# Patient Record
Sex: Male | Born: 1955 | Race: Black or African American | Hispanic: No | Marital: Single | Smoking: Former smoker
Health system: Southern US, Community
[De-identification: ages and names within clinical notes are randomized; demographics above are authoritative.]

---

## 2002-08-08 ENCOUNTER — Emergency Department (HOSPITAL_COMMUNITY): Admission: EM | Admit: 2002-08-08 | Discharge: 2002-08-08 | Payer: Self-pay | Admitting: Emergency Medicine

## 2008-03-14 ENCOUNTER — Inpatient Hospital Stay (HOSPITAL_COMMUNITY): Admission: EM | Admit: 2008-03-14 | Discharge: 2008-03-19 | Payer: Self-pay | Admitting: Emergency Medicine

## 2010-10-28 NOTE — Discharge Summary (Signed)
Cody Hammond, Cody Hammond                ACCOUNT NO.:  [id number]   MEDICAL RECORD NO.:  [id number]          PATIENT TYPE:  INP   LOCATION:  1404                         FACILITY:  [facility]   PHYSICIAN:  Altha Harm, MDDATE OF BIRTH:  1955/11/19   DATE OF ADMISSION:  03/11/2008  DATE OF DISCHARGE:  03/19/2008                               DISCHARGE SUMMARY   DISCHARGE DISPOSITION:  [facility] assisted living facility.   DISCHARGE DIAGNOSES:  1. Hypertensive urgency now resolved.  2. Hypertension, new diagnosis.  3. Schizotypal behavior.  4. Intractable emesis, resolved.  5. Adult failure to thrive.  6. Hypokalemia, resolved.   DISCHARGE MEDICATIONS:  1. Potassium chloride 40 mEq p.o. daily.  2. Thiamine 100 mg p.o. daily.  3. Folate 1 mg p.o. daily.  4. Hydrochlorothiazide 25 mg p.o. daily.  5. Lopressor 25 mg p.o. b.i.d.  6. Remeron 7.5 mg p.o. nightly.  7. Norvasc 2.5 mg p.o. daily.   CONSULTANT:  Dr. Jeanie Sewer, psychiatry.   PROCEDURE:  None.   DIAGNOSTIC STUDIES:  1. CT of the head without contrast done on admission which shows no      acute intracranial abnormalities.  2. Acute abdominal series done on the September 1938 which shows      possible proximal left ureteral calculus.  Consider noncontrast      abdomen and pelvis CT further evaluation if clinically warranted.      No active cardiopulmonary disease.  3. CT abdomen and pelvis without contrast which shows no acute      findings.      a.     No evidence of renal calculi or hydronephrosis.      b.     No evidence of urethral calculus.      c.     Mildly enlarged prostate cancer.      d.     Mild diffuse bladder wall thickening which may be due to       bladder outlet obstruction or possibly cystitis.  Clinical       correlation recommended.   ALLERGIES:  No known drug allergies.   CODE STATUS:  Full Code.   PRIMARY CARE:  Unassigned.   CHIEF COMPLAINT:  Weakness.   HISTORY OF PRESENT ILLNESS:  Please  refer to the H and P by Dr. Elmyra Ricks for details of the HPI.   HOSPITAL COURSE BY PROBLEM:  1. Adult failure to thrive.  The patient was found in the parking lot      and brought to the emergency room on observation basis.  The      patient did have a history of remote alcohol use.  However, with      the patient being unable to give any information, the patient was      started on Ativan prophylaxis against withdrawal, given the fact      his alcohol level was low.  The patient was able to talk, in the      discussion, he indicated that he had not drank alcohol in several      years, and  then the Ativan was discontinued.  The patient, however,      was having a poor appetite and stated he had not been taking for      some time.  A diet was started on the patient and the patient then      developed intractable vomiting for unknown etiologies.  The patient      had an investigation for the vomiting and all labs and radiologic      studies were found to be negative.  The patient in the meantime was      given IV hydration and started back on a diet of clear liquids and      advanced to a low-sodium diet, which he tolerated well.  2. Hypertension.  The patient was set to be discharged and developed      high blood pressures.  His blood pressures were markedly elevated      with systolic in the 200s and diastolics above 100.  The patient      was started on therapy and his medications titrated to the above      medications.  The patient's blood pressure is 140/86 at the time of      discharge.  The patient is to be followed up in the outpatient      setting for further titration of his medications.  3. Schizotypal behavior.  Initially there was no information regarding      this patient and he was unable to get very much on admission about      himself.  However, personnel from [employer] who know the      patient from him hanging around the [employer] and sitting in      the  parking lot came forward and volunteered some information about      the patient, lives in poor living conditions.  Social work was      contacted and when they investigated, they found this to be true.      A psychiatric evaluation was obtained and the patient was found to      be lacking capacity.  The patient is being sent to [facility]      assisted living facility.  The patient's payee and social worker      says the social service systems are involved in the plan and      execution his living plans for the patient.  The patient was      started on Remeron by psychiatry 7.5 mg p.o. nightly.  The patient      had some mild electrolyte imbalances including hypokalemia which      was repleted and the patient is to continue on potassium on an      ongoing basis.  Otherwise, the patient remained stable and he is      stable for discharge day.   FOLLOW UP:  The patient should establish with a primary care physician  to follow up within 2 weeks for titration of the blood pressure  medications.  In terms of his dietary restrictions, the patient should  be on a 2 grams sodium diet.  Please note, the patient was found to have  marginally elevated cholesterol with an LDL only 102.  He was not  started on any medications as this can be diet modified, and I will  defer that to his primary care physician as he establishes in the  outpatient setting.  However, I would emphasize a low-cholesterol, low-  sodium diet on this patient.  He was also tested for diabetes and his  hemoglobin A1c was 5.6, thus indicating no evidence of diabetes at this  time.  Physical restrictions, there are none.      Altha Harm, MD  Electronically Signed     MAM/MEDQ  D:  03/19/2008  T:  03/19/2008  Job:  [id number]

## 2010-10-28 NOTE — H&P (Signed)
Cody Hammond, Cody Hammond                ACCOUNT NO.:  [id number]   MEDICAL RECORD NO.:  [id number]          PATIENT TYPE:  INP   LOCATION:  1404                         FACILITY:  [facility]   PHYSICIAN:  Della Goo, M.D. DATE OF BIRTH:  [birth year]   DATE OF ADMISSION:  03/11/2008  DATE OF DISCHARGE:                              HISTORY & PHYSICAL   CHIEF COMPLAINT:  Weakness.   HISTORY OF THE PRESENT ILLNESS:  This is a 55 year old homeless male who  was brought to the emergency department by the Emergency Medical  Services after they  were called by bystanders who found him on the  sidewalk unable to get up.  The patient reports having increased pain in  both his legs with severe cramping.  He denies having any nausea,  vomiting, diarrhea, fevers, chills, chest pain, or shortness of breath.  He does report that he has been unable to eat very much.  He reports he  does not have an appetite.  He denies alcohol abuse or usage and drug  usage.   PAST MEDICAL HISTORY:  The past medical history is none per the  patient's report.   PAST SURGICAL HISTORY:  The past surgical history is none per the  patient's report.   ALLERGIES:  No known drug allergies.   SOCIAL HISTORY:  The patient is homeless.  He reports staying around the  [facility] area.  He reports tobacco usage.  Denies alcohol usage  and denies any drug usage.   FAMILY HISTORY:  The family history is noncontributory.   REVIEW OF SYSTEMS:  On the review of systems the pertinents are  mentioned above.   PHYSICAL EXAMINATION:  GENERAL APPEARANCE:  The physical examination  findings are that this is a disheveled, cachectic and emaciated-  appearing 55 year old male in discomfort, but in no acute distress.  VITAL SIGNS:  His vital signs are temperature of 98.3, blood pressure of  179/94, heart rate of 95, respirations of 20, and O2 saturations of 99%  on room air.  HEENT:  Normocephalic and atraumatic.  Temporal wasting  is present  bilaterally.  Pupils are equally round and react to light.  Extraocular  movements are intact.  Funduscopic is benign.  There is no scleral  icterus.  Oropharynx is clear.  Dentition is poor.  NECK:  The neck is supple.  Full range of motion.  No thyromegaly,  adenopathy or jugular venous distention.  HEART:  Cardiovascular reveals regular rate and rhythm.  No murmurs,  gallops or rubs.  LUNGS:  The lungs are clear to auscultation bilaterally.  ABDOMEN:  The abdomen has positive bowel sounds. It is soft, nontender  and nondistended.  EXTREMITIES:  The extremities are without cyanosis, clubbing or edema.  NEUROLOGIC:  The neurologic examination shows generalized weakness.  Speech is clear.  The patient is alert and oriented x3.  There are no  focal findings.   LABORATORY STUDIES:  White blood cell count 7.0, hemoglobin 13.2,  hematocrit 40.4 and platelets 159,000 with neutrophils 84% and  lymphocytes 10%.  Sodium 135, potassium 3.6, chloride 100, bicarb 27,  BUN 11, creatinine 1.0, and glucose 101.  CAT scan of the head was  performed by the ED physician the results of which were negative.  Alcohol level was also less than 5.0.   ASSESSMENT:  This is a 55 year old male being admitted with:  1. Weakness.  2. Failure to thrive.  3. Elevated blood pressure; probable hypertension.  4. Tobacco abuse.  5. Suspected alcohol use or abuse.   PLAN:  1. The patient will be admitted to a telemetry area for cardiac      monitoring.  2. The patient will be placed on a clear liquid diet for now.  3. IV fluids have been ordered for maintenance therapy and      rehydration.  4. The patient will be placed on clonidine as needed for elevated      blood pressures and his blood pressures will be further monitored.  5. GI and DVT prophylaxes have been ordered as well; and,  6. Case management evaluation will be requested.      Della Goo, M.D.  Electronically Signed      HJ/MEDQ  D:  03/12/2008  T:  03/13/2008  Job:  [id number]
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
## 2011-03-16 LAB — POCT I-STAT, CHEM 8
BUN: 11
Calcium, Ion: 1.02 — ABNORMAL LOW
Creatinine, Ser: 1
Glucose, Bld: 101 — ABNORMAL HIGH
Sodium: 135
TCO2: 27

## 2011-03-16 LAB — COMPREHENSIVE METABOLIC PANEL
ALT: 10
AST: 16
AST: 22
Albumin: 3 — ABNORMAL LOW
Albumin: 3.6
Alkaline Phosphatase: 39
BUN: 4 — ABNORMAL LOW
BUN: 4 — ABNORMAL LOW
CO2: 27
Calcium: 8.7
Chloride: 103
Chloride: 99
Creatinine, Ser: 0.75
Creatinine, Ser: 0.89
GFR calc Af Amer: >60
GFR calc non Af Amer: >60
Glucose, Bld: 88
Potassium: 3.2 — ABNORMAL LOW
Sodium: 137
Total Bilirubin: 0.7
Total Bilirubin: 1.2
Total Protein: 7.1

## 2011-03-16 LAB — CBC
HCT: 40.4
HCT: 43.2
MCHC: 32.6
MCV: 84.1
MCV: 84.5
MCV: 86.9
Platelets: 211
Platelets: 259
RBC: 4.23
RDW: 14
WBC: 5
WBC: 7

## 2011-03-16 LAB — URINALYSIS, ROUTINE W REFLEX MICROSCOPIC
Nitrite: NEGATIVE
Specific Gravity, Urine: 1.01
pH: 6

## 2011-03-16 LAB — RAPID URINE DRUG SCREEN, HOSP PERFORMED
Barbiturates: NOT DETECTED
Cocaine: NOT DETECTED
Opiates: NOT DETECTED

## 2011-03-16 LAB — MAGNESIUM: Magnesium: 2

## 2011-03-16 LAB — DIFFERENTIAL
Eosinophils Absolute: 0
Eosinophils Relative: 0
Lymphs Abs: 0.7

## 2011-03-16 LAB — CK: Total CK: 81

## 2011-03-17 LAB — BASIC METABOLIC PANEL
BUN: 3 — ABNORMAL LOW
BUN: 3 — ABNORMAL LOW
CO2: 32
CO2: 33 — ABNORMAL HIGH
CO2: 33 — ABNORMAL HIGH
Calcium: 8.6
Calcium: 8.8
Chloride: 100
Chloride: 100
Chloride: 97
Creatinine, Ser: 0.74
Creatinine, Ser: 0.75
Creatinine, Ser: 0.79
GFR calc Af Amer: >60
Glucose, Bld: 101 — ABNORMAL HIGH
Glucose, Bld: 109 — ABNORMAL HIGH
Glucose, Bld: 98
Potassium: 3.5

## 2011-03-17 LAB — LIPID PANEL
Cholesterol: 156
LDL Cholesterol: 102 — ABNORMAL HIGH
Total CHOL/HDL Ratio: 3.7
Triglycerides: 61
VLDL: 12

## 2011-03-17 LAB — GLUCOSE, CAPILLARY

## 2012-12-28 ENCOUNTER — Emergency Department (INDEPENDENT_AMBULATORY_CARE_PROVIDER_SITE_OTHER)
Admission: EM | Admit: 2012-12-28 | Discharge: 2012-12-28 | Disposition: A | Discharge status: Home / Self Care | Payer: Medicaid Other | Source: Home / Self Care | Attending: Family Medicine | Admitting: Family Medicine

## 2012-12-28 ENCOUNTER — Emergency Department (INDEPENDENT_AMBULATORY_CARE_PROVIDER_SITE_OTHER): Payer: Medicaid Other

## 2012-12-28 ENCOUNTER — Encounter (HOSPITAL_COMMUNITY): Payer: Self-pay | Admitting: Emergency Medicine

## 2012-12-28 DIAGNOSIS — R627 Adult failure to thrive: Secondary | ICD-10-CM

## 2012-12-28 DIAGNOSIS — I10 Essential (primary) hypertension: Secondary | ICD-10-CM

## 2012-12-28 DIAGNOSIS — R911 Solitary pulmonary nodule: Secondary | ICD-10-CM

## 2012-12-28 LAB — COMPREHENSIVE METABOLIC PANEL
AST: 16 U/L (ref 0–37)
Albumin: 3.9 g/dL (ref 3.5–5.2)
BUN: 8 mg/dL (ref 6–23)
Chloride: 98 mEq/L (ref 96–112)
Creatinine, Ser: 0.82 mg/dL (ref 0.50–1.35)
Total Bilirubin: 0.4 mg/dL (ref 0.3–1.2)
Total Protein: 7.7 g/dL (ref 6.0–8.3)

## 2012-12-28 LAB — POCT I-STAT, CHEM 8
BUN: 8 mg/dL (ref 6–23)
Chloride: 99 mEq/L (ref 96–112)
HCT: 44 % (ref 39.0–52.0)
Potassium: 3.8 mEq/L (ref 3.5–5.1)
Sodium: 138 mEq/L (ref 135–145)

## 2012-12-28 LAB — TSH: TSH: 3.49 u[IU]/mL (ref 0.350–4.500)

## 2012-12-28 LAB — HIV ANTIBODY (ROUTINE TESTING W REFLEX): HIV: NONREACTIVE

## 2012-12-28 MED ORDER — TRIAMTERENE-HCTZ 37.5-25 MG PO TABS
0.5000 | ORAL_TABLET | Freq: Every day | ORAL | Status: AC
Start: 2012-12-28 — End: ?

## 2012-12-28 MED ORDER — MEGESTROL ACETATE 625 MG/5ML PO SUSP: 625.0000 mg | Freq: Every day | ORAL | Status: AC

## 2012-12-28 MED ORDER — ONE-A-DAY MENS PO TABS: 1.0000 | ORAL_TABLET | Freq: Every day | ORAL | Status: AC

## 2012-12-28 NOTE — ED Provider Notes (Signed)
History    CSN: [id number] Arrival date & time 12/28/12  1115  First MD Initiated Contact with Patient 12/28/12 1158     Chief Complaint  Patient presents with  . Weight Loss  . Polydipsia  . Polyuria   (Consider location/radiation/quality/duration/timing/severity/associated sxs/prior Treatment) HPI Comments: 57 year old male with history of a psychiatric disorder and failure to thrive in the past. Here with his counselor concerned about weight loss. As per records and as per patient's report he has had problems with weight gaining for several years. Appears like in the last 2 weeks he has lost about 10 pounds which is more drastic than insidious weight loss in the past. He denies abdominal pain nausea vomiting or diarrhea. Denies chest pain which cough or shortness of breath. Patient has contradictory information sometimes saying that he is hungry and thirsty most of the time then he tells me that he does not eat or drink much because  he doesn't feel hungry. He states that he has some foot available at home .he has had remote history of alcohol intake but states he has not had alcohol drinking in many years. He also states that he smoked since age 57 and stop smoking about 2 years ago.   History reviewed. No pertinent past medical history. History reviewed. No pertinent past surgical history. No family history on file. History  Substance Use Topics  . Smoking status: Former Games developer  . Smokeless tobacco: Not on file  . Alcohol Use: No    Review of Systems  Allergies  Review of patient's allergies indicates no known allergies.  Home Medications   Current Outpatient Rx  Name  Route  Sig  Dispense  Refill  . megestrol (MEGACE ES) 625 MG/5ML suspension   Oral   Take 5 mLs (625 mg total) by mouth daily.   150 mL   0   . multivitamin (ONE-A-DAY MEN'S) TABS   Oral   Take 1 tablet by mouth daily.   30 tablet   0   . triamterene-hydrochlorothiazide (MAXZIDE-25) 37.5-25 MG per  tablet   Oral   Take 0.5 tablets by mouth daily.   30 tablet   1    BP 163/97  Pulse 85  Temp(Src) 97.7 F (36.5 C) (Oral)  Resp 16  SpO2 100% Physical Exam  Constitutional: He is oriented to person, place, and time. No distress.  Patient is disheveled, cachectic with dirty clothing and bad smell. He is here with his psychology counselor     HENT:  Head: Normocephalic and atraumatic.  Mouth/Throat: No oropharyngeal exudate.  Poor dentition with generalized dental plaque.   Eyes: Pupils are equal, round, and reactive to light. No scleral icterus.  No exoftalmus  Neck: Neck supple. No JVD present. No thyromegaly present.  Cardiovascular: Normal rate and regular rhythm.   Pulmonary/Chest: No respiratory distress. He has no wheezes. He has no rales.  Abdominal: Soft. He exhibits no distension and no mass. There is no tenderness. There is no rebound and no guarding.  emanciated  Lymphadenopathy:    He has no cervical adenopathy.  Neurological: He is alert and oriented to person, place, and time.  Skin: No rash noted. He is not diaphoretic.  Psychiatric: He has a normal mood and affect.  Odd behavior perseverating. Asking me to go and pray in his church.    ED Course  Procedures (including critical care time) Labs Reviewed  POCT I-STAT, CHEM 8 - Abnormal; Notable for the following:    Calcium,  Ion 1.08 (*)    All other components within normal limits  COMPREHENSIVE METABOLIC PANEL  TSH  HIV ANTIBODY (ROUTINE TESTING)  CBC WITH DIFFERENTIAL   Dg Chest 2 View  12/28/2012   *RADIOLOGY REPORT*  Clinical Data: Weight loss.  CHEST - 2 VIEW  Comparison: None.  Findings: The chest is hyperexpanded with attenuation of the pulmonary vasculature. A subtle nodular opacity measuring 0.6 cm is seen in the right upper lobe projecting over the end of the right third rib.  Heart size is normal.  No pneumothorax or pleural effusion.  IMPRESSION:  1.  Subtle nodular opacity in the right upper  lobe cannot be characterized and may be related to the end of the right third rib. Repeat PA and lateral chest in 3-4 months is recommended. 2.  Pulmonary hyperexpansion with emphysema.   Original Report Authenticated By: Holley Dexter, M.D.   1. Failure to thrive in adult   2. Hypertension   3. Nodule of right lung     MDM  Impress chronic failure to thrive triggered by psychiatric condition. Mildly hypertensive otherwise stable vital signs here. No obvious signs of dehydration. No electrolytes imbalance on point-of-care i-STAT. Appears this patient has a case Production designer, theatre/television/film, a Child psychotherapist as well the psychology counselor that is here with him. Difficult to understand why this patient does not have a primary care provider. TSH, CBC with differential, complete metabolic panel and HIV test pending at the time of discharge. Recommended to establish care with a primary care provider to monitor his symptoms and determine if further workup is required. Needs repeat chest Xrays in 3-4 months.  A followup appointment was scheduled by our staff today with [facility] in 2 weeks.  Sharin Grave, MD 12/29/12 1020

## 2012-12-28 NOTE — ED Notes (Signed)
Call from lab; discussion

## 2012-12-28 NOTE — ED Notes (Signed)
Pt c/o approx 10lb weight loss in the past 2 weeks, Pt states he is always hungry and thirsty.  Pt states he last ate a sandwich on Monday, has not eaten since then.  Pt states he had some water to drink today.  Pt states he has frequent urination and irregular BM.  Pt denies N/V or any fever.  Pt has a representative from psychotherapy services with him today.  Pt is alert and oriented with no acute distress.  Leilani Able CMA student

## 2012-12-28 NOTE — ED Notes (Signed)
Pt was given a follow up appt with the [facility] for [facility]/03/13.  I told the Psych caseworker with him that it is very important that he keep this appt.  Also asked this case worker for the number to his case worker at social services and he did not have that information.  I told the Psych worker that if any of his labs came back abnormal that we would call him.  Dr. Alfonse Ras has copy of the caseworkers card.  Leilani Able  CMA Student

## 2013-01-03 ENCOUNTER — Ambulatory Visit: Payer: Medicaid Other | Attending: Family Medicine

## 2015-04-04 ENCOUNTER — Emergency Department (HOSPITAL_COMMUNITY)
Admission: EM | Admit: 2015-04-04 | Discharge: 2015-04-05 | Disposition: A | Discharge status: Home / Self Care | Payer: Medicaid Other | Attending: Emergency Medicine | Admitting: Emergency Medicine

## 2015-04-04 ENCOUNTER — Encounter (HOSPITAL_COMMUNITY): Payer: Self-pay | Admitting: Emergency Medicine

## 2015-04-04 ENCOUNTER — Emergency Department (HOSPITAL_COMMUNITY): Payer: Medicaid Other

## 2015-04-04 DIAGNOSIS — R5383 Other fatigue: Secondary | ICD-10-CM | POA: Insufficient documentation

## 2015-04-04 DIAGNOSIS — Z87891 Personal history of nicotine dependence: Secondary | ICD-10-CM | POA: Diagnosis not present

## 2015-04-04 LAB — COMPREHENSIVE METABOLIC PANEL
ALK PHOS: 54 U/L (ref 38–126)
ALT: 13 U/L — AB (ref 17–63)
AST: 20 U/L (ref 15–41)
Albumin: 4.3 g/dL (ref 3.5–5.0)
Anion gap: 10 (ref 5–15)
BILIRUBIN TOTAL: 0.4 mg/dL (ref 0.3–1.2)
BUN: 15 mg/dL (ref 6–20)
CALCIUM: 9.7 mg/dL (ref 8.9–10.3)
CHLORIDE: 96 mmol/L — AB (ref 101–111)
CO2: 28 mmol/L (ref 22–32)
CREATININE: 0.91 mg/dL (ref 0.61–1.24)
Glucose, Bld: 85 mg/dL (ref 65–99)
Potassium: 4.2 mmol/L (ref 3.5–5.1)
Sodium: 134 mmol/L — ABNORMAL LOW (ref 135–145)
TOTAL PROTEIN: 8 g/dL (ref 6.5–8.1)

## 2015-04-04 LAB — CBC WITH DIFFERENTIAL/PLATELET
BASOS ABS: 0 10*3/uL (ref 0.0–0.1)
Basophils Relative: 0 %
EOS PCT: 1 %
Eosinophils Absolute: 0.1 10*3/uL (ref 0.0–0.7)
HEMATOCRIT: 39.3 % (ref 39.0–52.0)
Hemoglobin: 12.9 g/dL — ABNORMAL LOW (ref 13.0–17.0)
LYMPHS ABS: 1.7 10*3/uL (ref 0.7–4.0)
LYMPHS PCT: 29 %
MCH: 28.3 pg (ref 26.0–34.0)
MCHC: 32.8 g/dL (ref 30.0–36.0)
MCV: 86.2 fL (ref 78.0–100.0)
Monocytes Absolute: 0.5 10*3/uL (ref 0.1–1.0)
Monocytes Relative: 9 %
NEUTROS ABS: 3.7 10*3/uL (ref 1.7–7.7)
Neutrophils Relative %: 61 %
Platelets: 248 10*3/uL (ref 150–400)
RBC: 4.56 MIL/uL (ref 4.22–5.81)
RDW: 14.8 % (ref 11.5–15.5)
WBC: 6 10*3/uL (ref 4.0–10.5)

## 2015-04-04 NOTE — ED Provider Notes (Signed)
CSN: [id number]     Arrival date & time 04/04/15  2018 History  By signing my name below, I, Freida Busman, attest that this documentation has been prepared under the direction and in the presence of Laurence Spates, MD . Electronically Signed: Freida Busman, Scribe. 04/05/2015. 12:24 AM.  Chief Complaint  Patient presents with  . Fatigue   The history is provided by the patient. No language interpreter was used.    HPI Comments:  TRAESON DUSZA is a 59 y.o. male who presents to the Emergency Department complaining of generalized weakness for ~ 1 week; states he feels like he has fluid on his heart and "just wants to get checked out". He denies a h/o same or h/o CHF.  When asked what he means by "fluid", pt unable to elaborate further. He reports associated anxiety and decreased PO intake. He also denies CP, SOB, fever, nausea, vomiting, diarrhea, blood in stool, difficulty urinating, rash, and abdominal pain. No alleviating factors noted. He denies significant past medical history but notes he is currently taking medications for tape worms. He also denies drinking, smoking, and use of illicit drugs. No significant weight loss.  History reviewed. No pertinent past medical history. History reviewed. No pertinent past surgical history. No family history on file. Social History  Substance Use Topics  . Smoking status: Former Games developer  . Smokeless tobacco: None  . Alcohol Use: No    Review of Systems  10 systems reviewed and all are negative for acute change except as noted in the HPI.  Allergies  Review of patient's allergies indicates no known allergies.  Home Medications   Prior to Admission medications   Medication Sig Start Date End Date Taking? Authorizing Provider  UNABLE TO FIND Take 1 tablet by mouth daily. Med Name: Something for tap worms   Yes Historical Provider, MD  megestrol (MEGACE ES) 625 MG/5ML suspension Take 5 mLs (625 mg total) by mouth daily. Patient not  taking: Reported on 04/05/2015 12/28/12   Christin Fudge Moreno-Coll, MD  multivitamin (ONE-A-DAY MEN'S) TABS Take 1 tablet by mouth daily. Patient not taking: Reported on 04/05/2015 12/28/12   Christin Fudge Moreno-Coll, MD  triamterene-hydrochlorothiazide (MAXZIDE-25) 37.5-25 MG per tablet Take 0.5 tablets by mouth daily. Patient not taking: Reported on 04/05/2015 12/28/12   Adlih Moreno-Coll, MD   BP 121/63 mmHg  Pulse 60  Temp(Src) 98.8 F (37.1 C) (Oral)  Resp 15  Ht  (1.753 m)  Wt 140 lb (63.504 kg)  BMI 20.67 kg/m2  SpO2 99% Physical Exam  Constitutional: He is oriented to person, place, and time. He appears well-developed and well-nourished. No distress.  Thin, chronically ill appearing in NAD  HENT:  Head: Normocephalic and atraumatic.  Moist mucous membranes  Eyes: Conjunctivae are normal. Pupils are equal, round, and reactive to light.  Neck: Neck supple.  Cardiovascular: Normal rate, regular rhythm and normal heart sounds.   No murmur heard. Pulmonary/Chest: Effort normal.  diminished breath sounds nml work of breathing  Abdominal: Soft. Bowel sounds are normal. He exhibits no distension. There is no tenderness.  Musculoskeletal: He exhibits no edema.  Neurological: He is alert and oriented to person, place, and time. He has normal reflexes. No cranial nerve deficit. He exhibits normal muscle tone.  Fluent speech 5/5 strength and nml sensation throughout nml finger to nose testing  Skin: Skin is warm and dry.  Psychiatric:  Quiet, avoids eye contact  Nursing note and vitals reviewed.   ED Course  Procedures  DIAGNOSTIC STUDIES:  Oxygen Saturation is 97% on RA, normal by my interpretation.    COORDINATION OF CARE:  12:09 AM Will order blood work, urinalysis and CXR. Discussed treatment plan with pt at bedside and pt agreed to plan.  Labs Review Labs Reviewed  CBC WITH DIFFERENTIAL/PLATELET - Abnormal; Notable for the following:    Hemoglobin 12.9 (*)    All other  components within normal limits  COMPREHENSIVE METABOLIC PANEL - Abnormal; Notable for the following:    Sodium 134 (*)    Chloride 96 (*)    ALT 13 (*)    All other components within normal limits  ACETAMINOPHEN LEVEL - Abnormal; Notable for the following:    Acetaminophen (Tylenol), Serum <10 (*)    All other components within normal limits  ETHANOL  LIPASE, BLOOD  TROPONIN I  SALICYLATE LEVEL  URINE RAPID DRUG SCREEN, HOSP PERFORMED  URINALYSIS, ROUTINE W REFLEX MICROSCOPIC ([facility])    Imaging Review Dg Chest 2 View  04/04/2015  CLINICAL DATA:  Weakness EXAM: CHEST - 2 VIEW COMPARISON:  12/28/2012 FINDINGS: Cardiac shadow is within normal limits. The lungs are well aerated bilaterally. Bilateral nipple shadows are again noted. No focal infiltrate is seen. The bony structures are within normal limits. IMPRESSION: No acute abnormality noted. Electronically Signed   By: Alcide CleverMark  Lukens M.D.   On: 04/04/2015 23:57   I have personally reviewed and evaluated these images and lab results as part of my medical decision-making.   EKG Interpretation   Date/Time:  Friday April 05 2015 00:37:51 EDT Ventricular Rate:  68 PR Interval:  159 QRS Duration: 93 QT Interval:  403 QTC Calculation: 429 R Axis:   85 Text Interpretation:  Sinus rhythm ST elevation suggests acute  pericarditis no previous available for comparison, no reciprocal changes  to suggest ischemia Confirmed by Khaliel Morey MD, [place] ([id number]) on 04/05/2015  2:25:27 AM      MDM   Final diagnoses:  Other fatigue   59yo M p/w fatigue without any other symptoms of fever, chest pain, shortness of breath, or weight loss. Pt thin but comfortable appearing at presentation. VS unremarkable. No focal findings on physical exam. EKG with possible early repol, less likely pericarditis as patient denies any CP/SOB and no recent illness. Labs including tox labs unremarkable w/ no explanation for pt's sx. He denies any bloody or dark  stools. Discussed results with patient and provided w/ free clinic information to establish PCP. Reviewed return precautions and patient voiced understanding. Pt discharged in satisfactory condition.  I personally performed the services described in this documentation, which was scribed in my presence. The recorded information has been reviewed and is accurate.    Laurence Spatesachel Morgan Vanessa Alesi, MD 04/05/15 [id number]

## 2015-04-04 NOTE — ED Notes (Signed)
Pt. arrived with EMS from street , reports fatigue " feels tired  " this week , poor appetite and generalized weakness onset this week . Ambulatory , respirations unlabored denies fever or chills.

## 2015-04-05 LAB — URINALYSIS, ROUTINE W REFLEX MICROSCOPIC
BILIRUBIN URINE: NEGATIVE
GLUCOSE, UA: NEGATIVE mg/dL
HGB URINE DIPSTICK: NEGATIVE
Ketones, ur: NEGATIVE mg/dL
Leukocytes, UA: NEGATIVE
Nitrite: NEGATIVE
PROTEIN: NEGATIVE mg/dL
Specific Gravity, Urine: 1.022 (ref 1.005–1.030)
Urobilinogen, UA: 0.2 mg/dL (ref 0.0–1.0)
pH: 6.5 (ref 5.0–8.0)

## 2015-04-05 LAB — SALICYLATE LEVEL: Salicylate Lvl: <4 mg/dL (ref 2.8–30.0)

## 2015-04-05 LAB — RAPID URINE DRUG SCREEN, HOSP PERFORMED
AMPHETAMINES: NOT DETECTED
BARBITURATES: NOT DETECTED
BENZODIAZEPINES: NOT DETECTED
Cocaine: NOT DETECTED
Opiates: NOT DETECTED
TETRAHYDROCANNABINOL: NOT DETECTED

## 2015-04-05 LAB — LIPASE, BLOOD: Lipase: 21 U/L (ref 11–51)

## 2015-04-05 LAB — ACETAMINOPHEN LEVEL

## 2015-04-05 LAB — TROPONIN I: Troponin I: <0.03 ng/mL (ref <0.031)

## 2015-04-05 LAB — ETHANOL

## 2015-04-05 NOTE — ED Notes (Signed)
Dr. Clarene DukeLittle bedside.

## 2015-04-05 NOTE — ED Notes (Signed)
EDP at bedside  

## 2015-04-05 NOTE — Discharge Instructions (Signed)
Fatigue Fatigue is feeling tired all of the time, a lack of energy, or a lack of motivation. Occasional or mild fatigue is often a normal response to activity or life in general. However, long-lasting (chronic) or extreme fatigue may indicate an underlying medical condition. HOME CARE INSTRUCTIONS  Watch your fatigue for any changes. The following actions may help to lessen any discomfort you are feeling:  Talk to your health care provider about how much sleep you need each night. Try to get the required amount every night.  Take medicines only as directed by your health care provider.  Eat a healthy and nutritious diet. Ask your health care provider if you need help changing your diet.  Drink enough fluid to keep your urine clear or pale yellow.  Practice ways of relaxing, such as yoga, meditation, massage therapy, or acupuncture.  Exercise regularly.   Change situations that cause you stress. Try to keep your work and personal routine reasonable.  Do not abuse illegal drugs.  Limit alcohol intake to no more than 1 drink per day for nonpregnant women and 2 drinks per day for men. One drink equals 12 ounces of beer, 5 ounces of wine, or 1 ounces of hard liquor.  Take a multivitamin, if directed by your health care provider. SEEK MEDICAL CARE IF:   Your fatigue does not get better.  You have a fever.   You have unintentional weight loss or gain.  You have headaches.   You have difficulty:   Falling asleep.  Sleeping throughout the night.  You feel angry, guilty, anxious, or sad.   You are unable to have a bowel movement (constipation).   You skin is dry.   Your legs or another part of your body is swollen.  SEEK IMMEDIATE MEDICAL CARE IF:   You feel confused.   Your vision is blurry.  You feel faint or pass out.   You have a severe headache.   You have severe abdominal, pelvic, or back pain.   You have chest pain, shortness of breath, or an  irregular or fast heartbeat.   You are unable to urinate or you urinate less than normal.   You develop abnormal bleeding, such as bleeding from the rectum, vagina, nose, lungs, or nipples.  You vomit blood.   You have thoughts about harming yourself or committing suicide.   You are worried that you might harm someone else.    This information is not intended to replace advice given to you by your health care provider. Make sure you discuss any questions you have with your health care provider.   Document Released: 03/29/2007 Document Revised: 06/22/2014 Document Reviewed: 10/03/2013 Elsevier Interactive Patient Education 2016 [employer].   Emergency Department Resource Guide 1) Find a Doctor and Pay Out of Pocket Although you won't have to find out who is covered by your insurance plan, it is a good idea to ask around and get recommendations. You will then need to call the office and see if the doctor you have chosen will accept you as a new patient and what types of options they offer for patients who are self-pay. Some doctors offer discounts or will set up payment plans for their patients who do not have insurance, but you will need to ask so you aren't surprised when you get to your appointment.  2) Contact Your Local Health Department Not all health departments have doctors that can see patients for sick visits, but many do, so it is  worth a call to see if yours does. If you don't know where your local health department is, you can check in your phone book. The CDC also has a tool to help you locate your state's health department, and many state websites also have listings of all of their local health departments.  3) Find a Walk-in Clinic If your illness is not likely to be very severe or complicated, you may want to try a walk in clinic. These are popping up all over the country in pharmacies, drugstores, and shopping centers. They're usually staffed by nurse practitioners  or physician assistants that have been trained to treat common illnesses and complaints. They're usually fairly quick and inexpensive. However, if you have serious medical issues or chronic medical problems, these are probably not your best option.  No Primary Care Doctor: - Call Health Connect at  [phone] - they can help you locate a primary care doctor that  accepts your insurance, provides certain services, etc. - Physician Referral Service- [phone]  Chronic Pain Problems: Organization         Address  Phone   Notes  [facility]  [phone] Patients need to be referred by their primary care doctor.   Medication Assistance: Organization         Address  Phone   Notes  [facility] Medication [facility] [address] [place], [place] [address] [phone]) [phone] --Must be a resident of [place] -- Must have NO insurance coverage whatsoever (no Medicaid/ Medicare, etc.) -- The pt. MUST have a primary care doctor that directs their care regularly and follows them in the community   MedAssist  [phone]   [employer]  [phone]    Agencies that provide inexpensive medical care: Organization         Address  Phone   Notes  [facility]  [phone]   [facility]    [phone]   [facility] [address] [place], [place] [address] [phone]   [facility] [address], [place] [phone](336) [phone]   Planned Parenthood    [phone]) [phone]   [facility]    [phone]   [facility]  [address], [place] Phone:  [phone], Fax:  [phone] Hours of Operation:  9 am - 6 pm, M-F.  Also accepts Medicaid/Medicare and self-pay.  [facility]  [address], [place] Phone: [phone], Fax: [phone]. Hours of Operation:  8:30 am - 5:30 pm, M-F.   Also accepts Medicaid and self-pay.  [facility] [place] [address], [place] Phone: [phone]) [phone]   [facility] [address], [place], [place] [phone], Ext. [phone] Mondays & Thursdays: 7-9 AM.  First 15 patients are seen on a first come, first serve basis.    Medicaid-accepting [facility] Providers:  Organization         Address  Phone   Notes  [facility] [address], [place] [phone]) [phone] Also accepts self-pay patients.  [facility] [address], [place]  [phone]) [phone]   [facility] [address], [place] [phone]) [phone]   [facility] [address] [place] [address], [place] [phone]   Renaye RakersVeita Bland [address], [place]   [phone] Only accepts [place] [employer] patients after they have their name applied to their card.   Self-Pay (no insurance) in [facility]:  Organization         Address  Phone   Notes  Sickle Cell Patients, [facility] [address] [place], [place] [phone]   [facility] [address] [place], [place] [phone]   [facility] [place]  [address], [place] [phone]   [facility]/Dr. Osei-Bonsu  [address] [place] or [address], [place] [phone] Phone number for both [place] and [place] locations is the same.  [facility] [address], [place] [phone]   [facility] [address], [place] or [address] [phone]   [facility] [address], [place] [phone], phone; [phone], fax Sees patients 1st and 3rd Saturday of every month.  Must not qualify for public or private insurance (i.e. Medicaid, Medicare, [place] Health Choice, Veterans'  Benefits)  Household income should be no more than 200% of the poverty level The clinic cannot treat you if you are pregnant or think you are pregnant  Sexually transmitted diseases are not treated at the clinic.    Dental Care: Organization         Address  Phone  Notes  [facility] Department of [facility] [address] [place], [place] [phone] Accepts children up to age 69 who are enrolled in [place] or [place] Health Choice; pregnant women with a Medicaid card; and children who have applied for Medicaid or [place] Health Choice, but were declined, whose parents can pay a reduced fee at time of service.  [facility] of [facility]  [address], [place] [phone] Accepts children up to age 57 who are enrolled in [place] or [place] Health Choice; pregnant women with a Medicaid card; and children who have applied for Medicaid or [place] Health Choice, but were declined, whose parents can pay a reduced fee at time of service.  [facility]  [address] [place], [place] [phone] Patients are seen by appointment only. Walk-ins are not accepted. [facility] will see patients 37 years of age and older. Monday - Tuesday (8am-5pm) Most Wednesdays (8:30-5pm) $30 per visit, cash only  [facility] Adult [facility] Access [facility]  [address], [facility] [phone] Patients are seen by appointment only. Walk-ins are not accepted. [facility] will see patients 1 years of age and older. One Wednesday Evening (Monthly: Volunteer Based).  $30 per visit, cash only  [employer]  [phone] for adults; Children under age 11, call Graduate Pediatric Dentistry at [phone]. Children aged 54-14, please call [phone] to request a pediatric application.  Dental services are provided in all areas of dental care including fillings, crowns and bridges, complete and partial  dentures, implants, gum treatment, root canals, and extractions. Preventive care is also provided. Treatment is provided to both adults and children. Patients are selected via a lottery and there is often a waiting list.   [facility] [address], [place]  [phone] [email or web address]   [facility] [address] [place], [place] [phone], Ext. [phone] Second and Fourth Thursday of  each month, opens at 6:30 AM; Clinic ends at 9 AM.  Patients are seen on a first-come first-served basis, and a limited number are seen during each clinic.   [facility]  [address], [place], [place] [phone]   Eligibility Requirements You must have lived in [place], [place], or [place] for at least the last three months.   You cannot be eligible for state or federal sponsored National Cityhealthcare insurance, including CIGNAVeterans Administration, [place]icaid, or [employer] EntertainmentMedicare.   You generally cannot be eligible for healthcare insurance through your employer.    How to apply: Eligibility screenings are held every Tuesday and Wednesday afternoon from 1:00 pm until 4:00 pm. You do not need an appointment for the interview!  [facility] [address], [place], [place] [phone]   [facility]  [phone]   [facility]  [phone]   [facility]  [phone]    Behavioral Health Resources in the Community: Intensive Outpatient Programs Organization         Address  Phone  Notes  [facility] [address], [place], [place] [phone]   [facility] [address], [place], [place] [phone]   [facility] [address], [place], [place]  [phone]   [facility] [address],  [place], [place] [phone] or [phone]   Substance Abuse Resources Organization          Address  Phone  Notes  [facility]  [phone]   [facility]  [phone]   [facility]  [phone]   [facility]  [phone]   Residential & Outpatient Substance Abuse Program  [phone]   Psychological Services Organization         Address  Phone  Notes  [facility]  [phone]- [phone]   [facility]  [phone]- [phone]   [facility] [address], [place] [phone] or [phone]    Mobile Crisis Teams Organization         Address  Phone  Notes  [facility], Mobile Crisis Care Unit  [phone]   [facility]  [address] [place], [place] [phone]   Doristine LocksSharon DeEsch [address] [place] [phone]    Self-Help/Support Groups Organization         Address  Phone             Notes  [facility] [place] - variety of support groups  [phone] Call for more information  Narcotics Anonymous (NA), [facility] [address], [place]  2 meetings at this location   Statisticianesidential Treatment Programs Organization         Address  Phone  Notes  [facility] [address],    [place]  [phone]   [facility]  [address], [place] [phone], [place], [place] [phone]   [facility] [address], [place] [phone] Admissions: 8am-3pm M-F  [facility] [address]    [place], [place] [phone]   [facility] [address], [place], [place] [phone]   [facility] [address]  [place], [place] [phone]   [facility] - Intensive Outpatient [address], [place], [place] [phone]   [facility] ([facility]iction Recovery [facility] [address]  [place],  [place] [phone] or [phone]   [facility] ([facility]) [address] [place], [place]  [phone] Accepts Medicaid  [facility] [address]  [place] [phone] Substance Abuse/Addiction Treatment   [facility] Behavioral Health Resources Organization         Address  Phone  Notes  [facility]  [phone]) [phone]   Angie FavaJulie Brannon, PhD [address], [address] [place], [place]   [phone](336) [phone] or [phone]   [facility] [place]   [address] [place], [place] [phone]) [phone]   [facility] [address], [place], [place] [phone]) [phone] Insurance/Medicaid/sponsorship through [facility]  [facility] Families [address]                                    [place], [place] [phone]) [phone] Therapy/tele-psych/case  [facility] [address] Falls.   [place], [place] [phone]) [phone]    Dr. Lolly MustacheArfeen  [phone]) [phone]   [facility] [place]  [employer] [facility] 1) [address], [place] 2) [address], [place] 3)  [address], [place] [phone]  [phone](336) [phone]   [facility] Child Abuse Hotline [phone]) [phone] or [phone](336) [phone] (After Hours)

## 2015-04-09 ENCOUNTER — Telehealth (HOSPITAL_COMMUNITY): Payer: Self-pay

## 2016-02-01 IMAGING — CR DG CHEST 2V
2 series · 2 of 2 positions shown · non-contrast
Comparison: 12/28/2012

CLINICAL DATA: Weakness

EXAM:
CHEST - 2 VIEW

[chest pa]
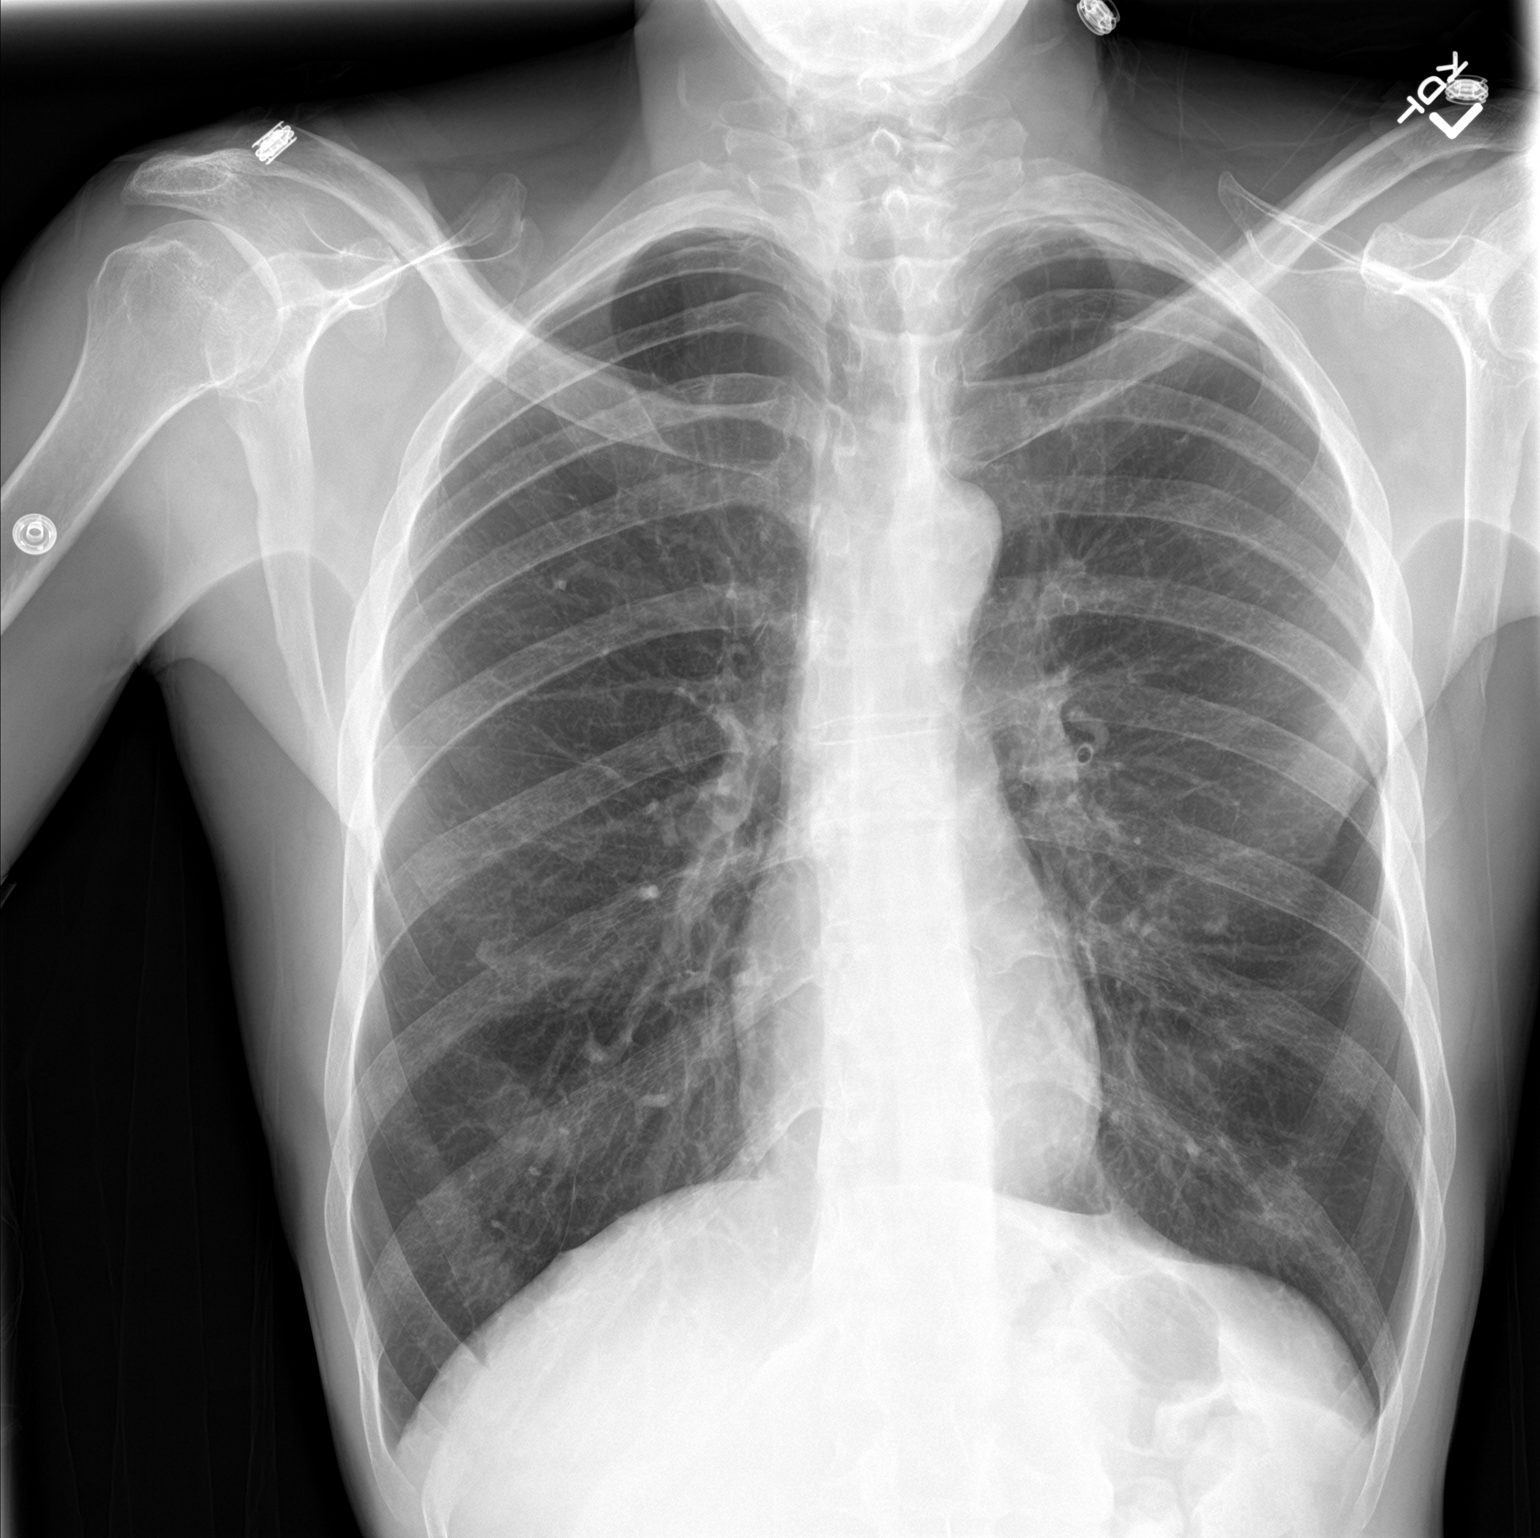

[chest lat]
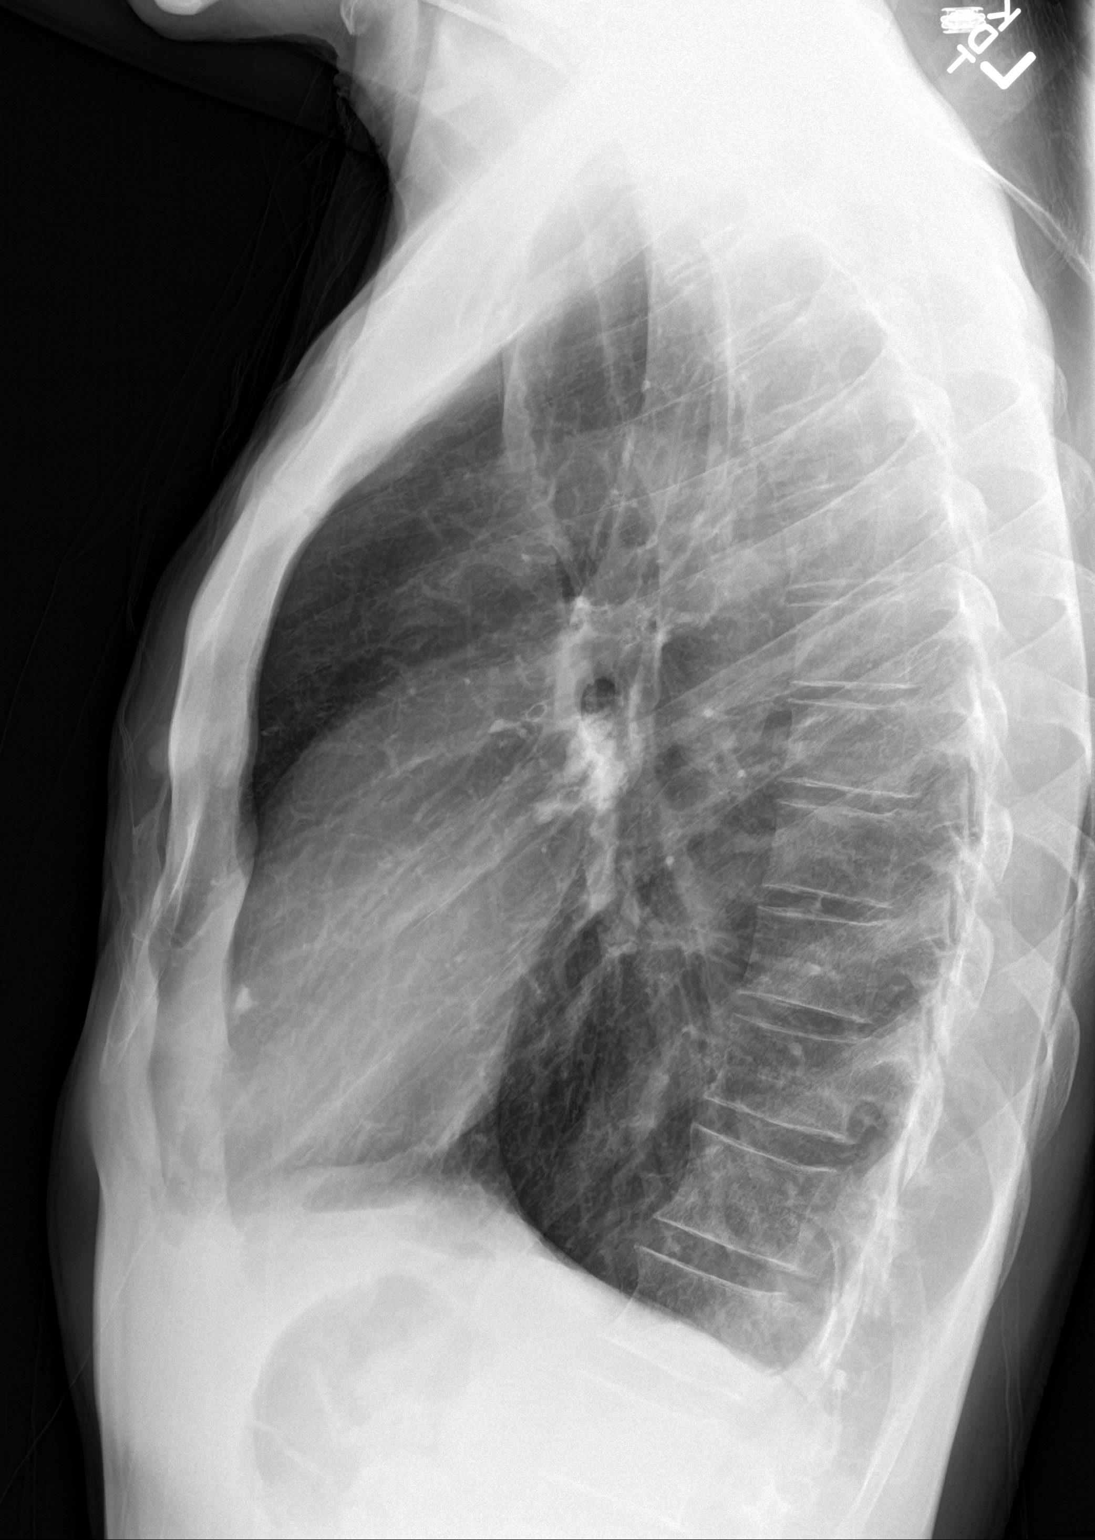

[2 of 2 positions shown; findings below may reference images not displayed]

FINDINGS: Cardiac shadow is within normal limits. The lungs are well aerated
bilaterally. Bilateral nipple shadows are again noted. No focal
infiltrate is seen. The bony structures are within normal limits.
IMPRESSION: No acute abnormality noted.

## 2021-04-09 ENCOUNTER — Other Ambulatory Visit: Payer: Self-pay | Admitting: Internal Medicine

## 2021-04-10 LAB — COMPLETE METABOLIC PANEL WITH GFR
AG Ratio: 1.3 (calc) (ref 1.0–2.5)
ALT: 16 U/L (ref 9–46)
AST: 17 U/L (ref 10–35)
Albumin: 3.8 g/dL (ref 3.6–5.1)
Alkaline phosphatase (APISO): 40 U/L (ref 35–144)
BUN: 15 mg/dL (ref 7–25)
CO2: 25 mmol/L (ref 20–32)
Calcium: 8.9 mg/dL (ref 8.6–10.3)
Chloride: 102 mmol/L (ref 98–110)
Creat: 0.87 mg/dL (ref 0.70–1.35)
Globulin: 3 g/dL (calc) (ref 1.9–3.7)
Glucose, Bld: 82 mg/dL (ref 65–99)
Potassium: 4.3 mmol/L (ref 3.5–5.3)
Sodium: 141 mmol/L (ref 135–146)
Total Bilirubin: 0.4 mg/dL (ref 0.2–1.2)
Total Protein: 6.8 g/dL (ref 6.1–8.1)
eGFR: 96 mL/min/{1.73_m2} (ref >=60)

## 2021-04-10 LAB — TSH: TSH: 2.51 mIU/L (ref 0.40–4.50)

## 2021-04-10 LAB — LIPID PANEL
Cholesterol: 173 mg/dL (ref <200)
HDL: 42 mg/dL (ref >=40)
LDL Cholesterol (Calc): 113 mg/dL (calc) — ABNORMAL HIGH
Non-HDL Cholesterol (Calc): 131 mg/dL (calc) — ABNORMAL HIGH (ref <130)
Total CHOL/HDL Ratio: 4.1 (calc) (ref <5.0)
Triglycerides: 82 mg/dL (ref <150)

## 2021-04-10 LAB — CBC
HCT: 37.7 % — ABNORMAL LOW (ref 38.5–50.0)
Hemoglobin: 12.3 g/dL — ABNORMAL LOW (ref 13.2–17.1)
MCH: 28.1 pg (ref 27.0–33.0)
MCHC: 32.6 g/dL (ref 32.0–36.0)
MCV: 86.1 fL (ref 80.0–100.0)
MPV: 9.4 fL (ref 7.5–12.5)
Platelets: 394 10*3/uL (ref 140–400)
RBC: 4.38 10*6/uL (ref 4.20–5.80)
RDW: 13.2 % (ref 11.0–15.0)
WBC: 7.2 10*3/uL (ref 3.8–10.8)

## 2021-04-10 LAB — VITAMIN D 25 HYDROXY (VIT D DEFICIENCY, FRACTURES): Vit D, 25-Hydroxy: 47 ng/mL (ref 30–100)
